# Patient Record
Sex: Female | Born: 1955 | Race: White | Hispanic: No | Marital: Married
Health system: Southern US, Community
[De-identification: ages and names within clinical notes are randomized; demographics above are authoritative.]

---

## 2011-12-31 ENCOUNTER — Ambulatory Visit: Payer: Self-pay | Admitting: Family Medicine

## 2012-01-19 ENCOUNTER — Ambulatory Visit: Payer: Self-pay | Admitting: Family Medicine

## 2012-09-01 IMAGING — US ULTRASOUND LEFT BREAST
1 series · 14 of 14 positions shown · non-contrast
Comparison: Left Breast Ultrasound from [REDACTED] on
05/02/2010, mammograms from 01/10/2012.

REASON FOR EXAM: US LT ENLARGED LYMP NODES
COMMENTS:

PROCEDURE:     US  - US BREAST LEFT  - January 19, 2012 [DATE]
RESULT:

[Series 1: ultrasound left breast · 14 of 14 slices shown]
[im 1/14]
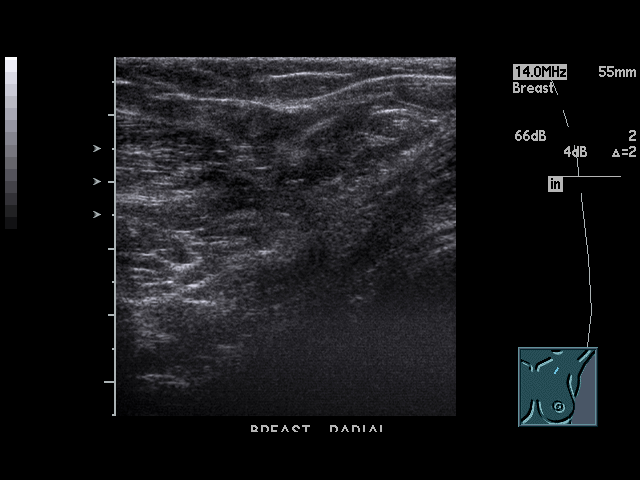
[im 2/14]
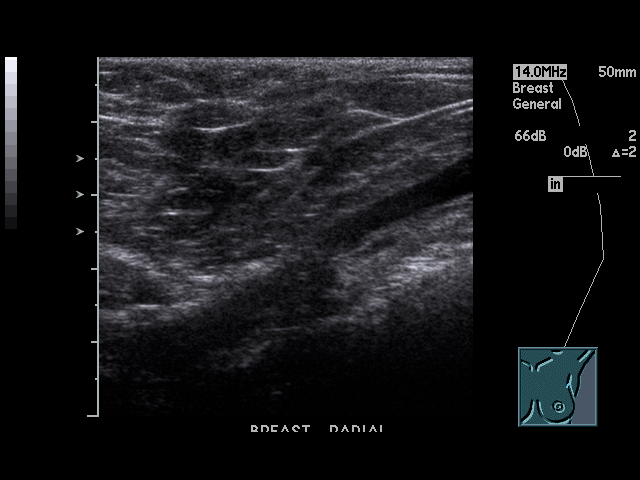
[im 3/14]
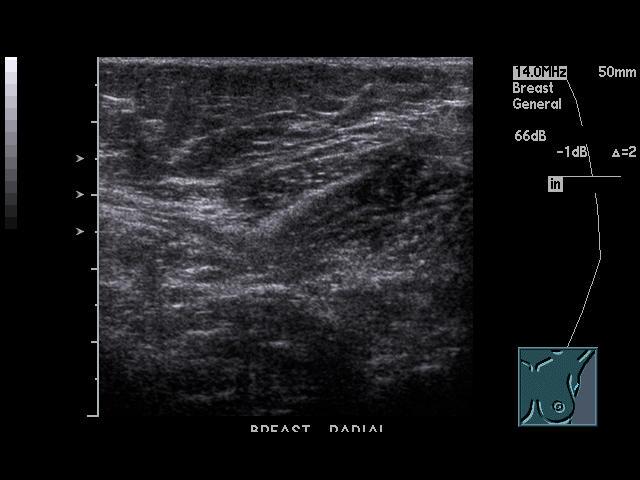
[im 4/14]
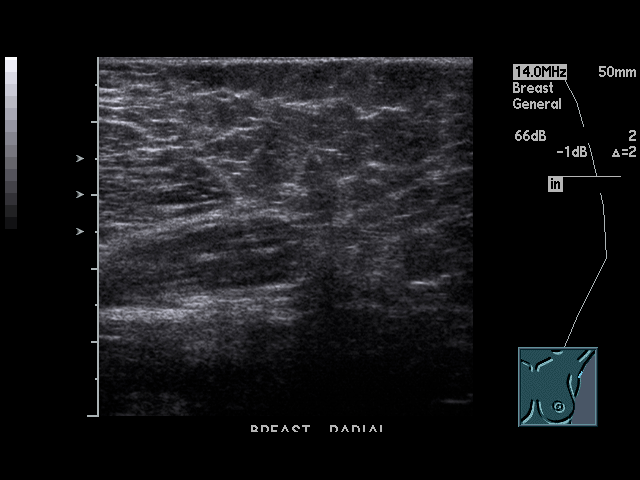
[im 5/14]
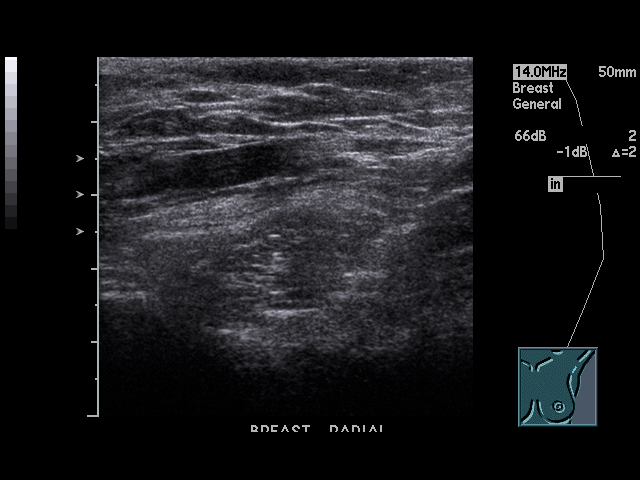
[im 6/14]
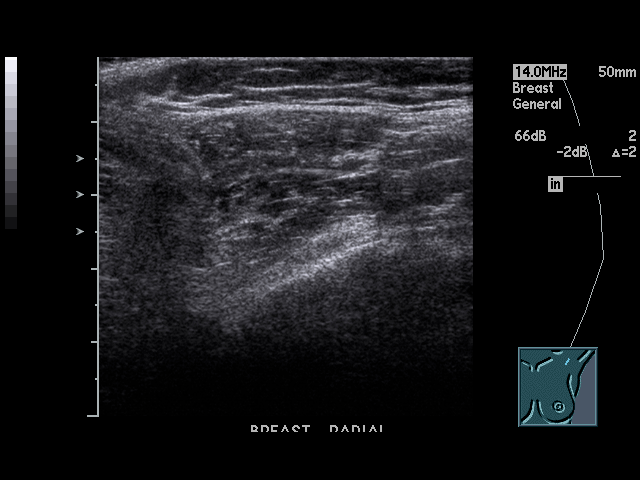
[im 7/14]
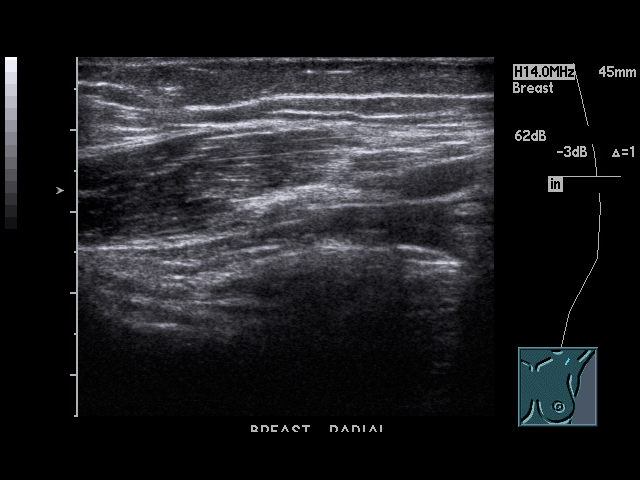
[im 8/14]
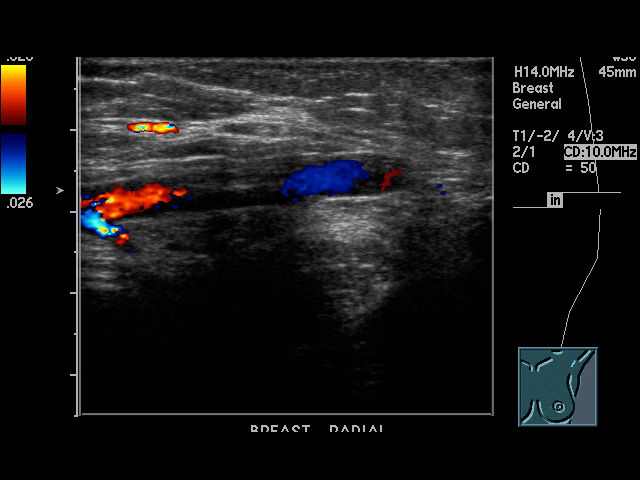
[im 9/14]
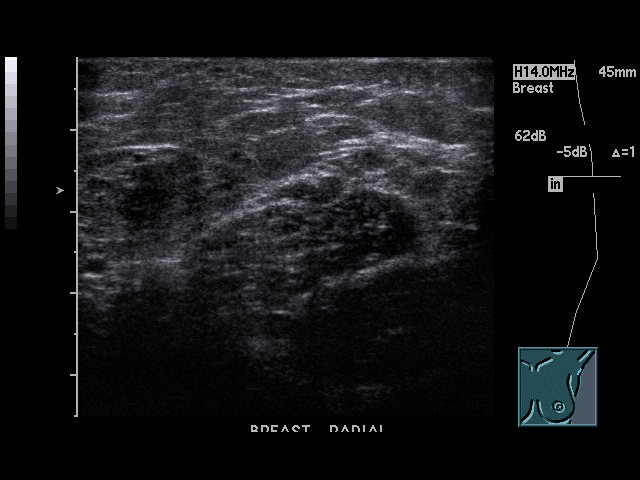
[im 10/14]
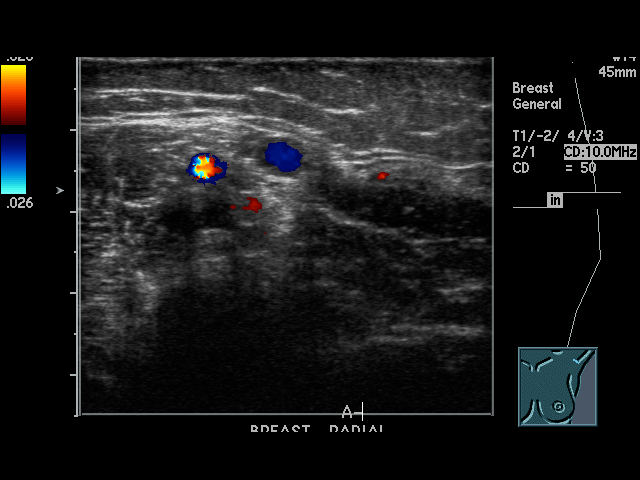
[im 11/14]
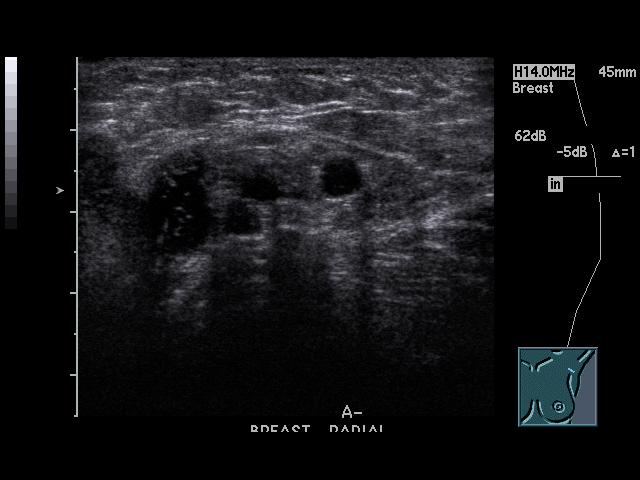
[im 12/14]
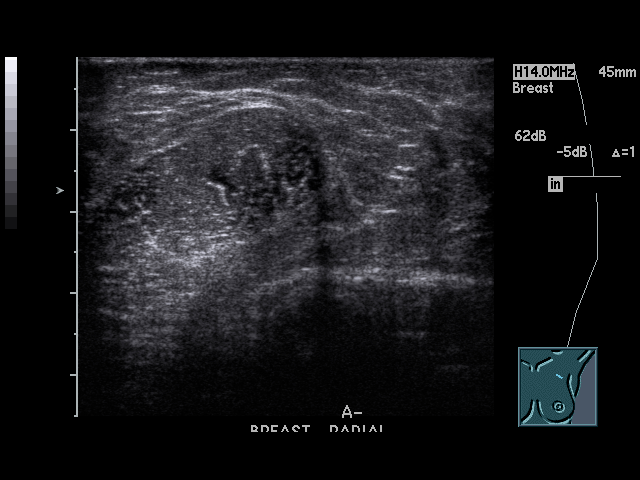
[im 13/14]
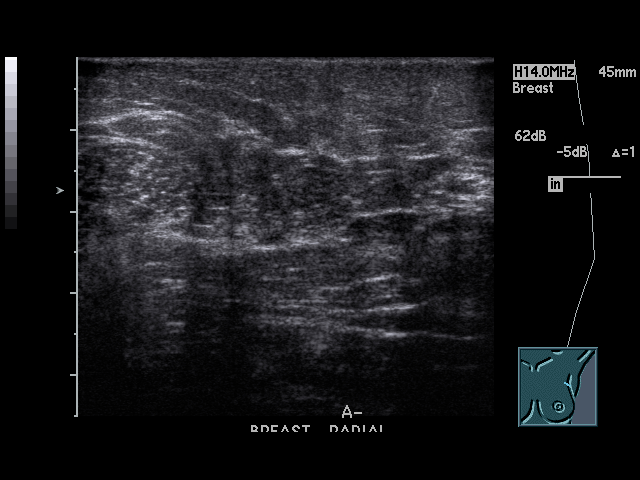
[im 14/14]
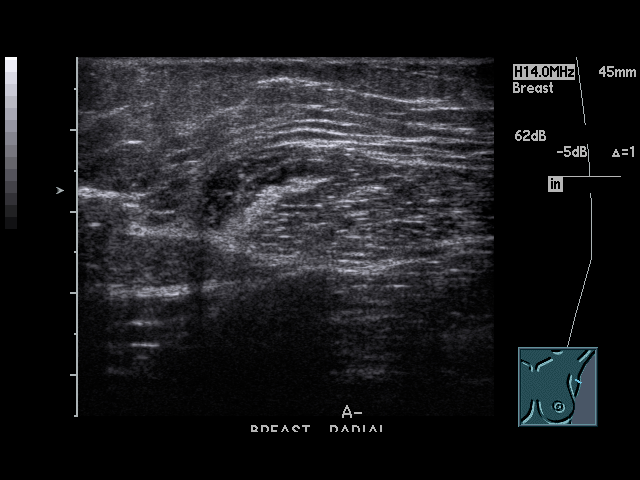

[14 of 14 positions shown; findings below may reference images not displayed]

FINDINGS: Real-time ultrasound was performed of the left axillary tail secondary to
follow-up recommendations from the prior Left Breast Ultrasound at the
outside institution. No mass or suspicious shadowing identified. No findings
to suggest lymph nodes seen. No findings are seen on today's study to
correlate with the findings on the outside prior ultrasound.
IMPRESSION: BI-RADS: Category 1 - Negative

RECOMMENDATION:  Continued annual screening mammography.

## 2014-11-23 ENCOUNTER — Ambulatory Visit: Payer: Self-pay | Admitting: Podiatry

## 2016-01-22 ENCOUNTER — Other Ambulatory Visit: Payer: Self-pay | Admitting: Family Medicine

## 2016-01-22 DIAGNOSIS — Z1231 Encounter for screening mammogram for malignant neoplasm of breast: Secondary | ICD-10-CM

## 2016-04-02 ENCOUNTER — Ambulatory Visit
Admission: RE | Admit: 2016-04-02 | Discharge: 2016-04-02 | Disposition: A | Discharge status: Home / Self Care | Payer: Self-pay | Source: Ambulatory Visit | Attending: Oncology | Admitting: Oncology

## 2016-04-02 ENCOUNTER — Ambulatory Visit: Payer: Self-pay | Attending: Oncology

## 2016-04-02 ENCOUNTER — Other Ambulatory Visit: Payer: Self-pay | Admitting: Oncology

## 2016-04-02 VITALS — BP 108/68 | HR 68 | Temp 98.0°F | Resp 12 | Ht 64.96 in | Wt 161.4 lb

## 2016-04-02 DIAGNOSIS — N644 Mastodynia: Secondary | ICD-10-CM

## 2016-04-02 NOTE — Progress Notes (Signed)
Subjective:     Patient ID: Kim Gregory, female   DOB: 1956/03/12, 60 y.o.   MRN: [id number]  HPI   Review of Systems     Objective:   Physical Exam  Pulmonary/Chest: Right breast exhibits no inverted nipple, no mass, no nipple discharge, no skin change and no tenderness. Left breast exhibits inverted nipple and tenderness. Left breast exhibits no mass, no nipple discharge and no skin change. Breasts are symmetrical.  Left nipple inversion normal per patient       Assessment:     60 [facility]   Patient screened, and meets B[facility]CP eligibility.  Patient does not have insurance, Medicare or Medicaid.  Handout given on Affordable Care Act. Instructed patient on breast self-exam using teach back method.  Patient had additional view ultrasound of left axillary nodes in 2013, and had a Birads 1 result.  Today she complains of left upper outer quadrant tenderness on palpation.  No mass our lump palpated.    Plan:     Sent for bilateral breast tomo mammogram, and ultrasound.

## 2016-04-03 NOTE — Progress Notes (Signed)
Letter mailed from [facility] to notify of normal mammogram results.  Patient to return in one year for annual screening.  Copy to [facility]. 

## 2016-12-24 ENCOUNTER — Ambulatory Visit: Payer: Medicare HMO | Attending: Neurology | Admitting: Physical Therapy

## 2016-12-24 DIAGNOSIS — R262 Difficulty in walking, not elsewhere classified: Secondary | ICD-10-CM | POA: Diagnosis present

## 2016-12-24 DIAGNOSIS — M6281 Muscle weakness (generalized): Secondary | ICD-10-CM | POA: Insufficient documentation

## 2016-12-24 DIAGNOSIS — R2689 Other abnormalities of gait and mobility: Secondary | ICD-10-CM | POA: Diagnosis present

## 2016-12-25 ENCOUNTER — Encounter: Payer: Self-pay | Admitting: Physical Therapy

## 2016-12-25 NOTE — Therapy (Signed)
[facility] [address] [place], [place], [address] Phone: [phone]   Fax:  [phone]  Physical Therapy Evaluation  Patient Details  Name: LORIE CLECKLEY MRN: [id number] Date of Birth: [birth year] Referring Provider: Dr. Cristopher Peru  Encounter Date: 12/24/2016      PT End of Session - 12/25/16 0925    Visit Number 1   Number of Visits 8   Date for PT Re-Evaluation 01/21/17   Authorization - Visit Number 1   Authorization - Number of Visits 10   PT Start Time 1015   PT Stop Time 1108   PT Time Calculation (min) 53 min   Equipment Utilized During Treatment Gait belt   Activity Tolerance Patient tolerated treatment well   Behavior During Therapy [facility] for tasks assessed/performed      History reviewed. No pertinent past medical history.  History reviewed. No pertinent surgical history.  There were no vitals filed for this visit.       Subjective Assessment - 12/25/16 0916    Subjective Pt. reports B LE muscle weakness and >6 falls over past several months.  Pts. sister report that she fell yesterday while carrying laundry basket over door threshold.  Pt. fell on knees (no injury reported).  Pt. reports 5/10 B plantar fascia pain.  Pt. enjoys reading/ crafts/ baking and is hoping to return to work.     Patient is accompained by: Family member   Limitations Standing;Walking;House hold activities;Other (comment)   Patient Stated Goals Increase B LE muscle strength to improve walking/ safety and return to work.    Currently in Pain? Yes   Pain Score 5    Pain Location Foot   Pain Type Chronic pain   Aggravating Factors  plantar fascia pain            [facility] PT Assessment - 12/25/16 0001      Assessment   Medical Diagnosis Imbalance/ Difficulty walking   Referring Provider Dr. Cristopher Peru   Onset Date/Surgical Date 12/23/15     Balance Screen   Has the patient fallen in the past 6 months Yes   How many times?  6+   Has the patient had a decrease in activity level because of a fear of falling?  Yes      Nustep L5 10 min. B UE/LE (consistent cadence/ no breaks).  Ambulate in PT clinic with instruction on 2-point gait pattern and use of SPC.  Max. Cuing for verbal cuing with demonstration provided.  Will issue HEP next tx. Session.         PT Education - 12/25/16 [phone]    Education provided Yes   Education Details Discussed walking/ ex. program/ [employer]   Person(s) Educated Patient   Methods Explanation;Demonstration   Comprehension Verbalized understanding;Returned demonstration             PT Long Term Goals - 12/25/16 0941      PT LONG TERM GOAL #1   Title Pt. I with HEP to increase B LE muscle strength 1/2 muscle grade to improve pain-free mobility/ independence with walking.    Baseline Pt. presents with good B UE/LE AROM WFL and B UE strength grossly 4/5 MMT, B LE strength grossly 4-/5 MMT except hip flexion 3+/5 MMT, hamstring/gluts 4+/5 MMT.   Time 4   Period Weeks   Status New     PT LONG TERM GOAL #2   Title Pt. will increase LEFS to >40 out  of 80 to improve functional mobility.     Baseline LEFS: 27 out of 80 on 1/3   Time 4   Period Weeks   Status New     PT LONG TERM GOAL #3   Title Pt. will increase Berg balance test to >50/56 to promote safety/ independence with gait.    Baseline Berg: 45/56 on 1/3.   Time 4   Period Weeks   Status New     PT LONG TERM GOAL #4   Title Pt. able to ambulate community distances with use of SPC and proper gait pattern to decrease fall risk/ improve safety.     Baseline ambulates with antalgic/ shuffling gait pattern with limited hip flexion.     Time 4   Period Weeks   Status New     PT LONG TERM GOAL #5   Title Pt. will report no falls or LOB consistent over a 2 week period to improve safety/ mobility.    Baseline 6 falls over past several months.     Time 4   Period Weeks   Status New               Plan  - 12/25/16 1610    Clinical Impression Statement Pt. is a pleasant 61 y/o female with c/o B peripheral neuropathy/ plantar foot pain and B LE muscle weakness.  Pt. reports >6 falls over past several months and states her walking has been getting worse.  Pt. will occasionally use of SPC while walking outside.  Pt. presents with good B UE/LE AROM WFL and B UE strength grossly 4/5 MMT, B LE strength grossly 4-/5 MMT except hip flexion 3+/5 MMT, hamstring/gluts 4+/5 MMT.  Pt. states she loses balance while bending over and looking up (posterior LOB).  LEFS: 27 out of 80.  Berg balance test: 45/56.  Pt. ambulates with shuffling gait pattern with limited B hip flexion/ heel strike without use of assistive device.  Pt. will benefit from skilled PT services to increase B LE muscle strength to improve safety/ walking with appriopriate assistive device.     Rehab Potential Good   PT Frequency 2x / week   PT Duration 4 weeks   PT Treatment/Interventions ADLs/Self Care Home Management;Aquatic Therapy;Gait training;Stair training;Functional mobility training;Therapeutic activities;Therapeutic exercise;Balance training;Patient/family education;Neuromuscular re-education;Manual techniques   PT Next Visit Plan ISSUE HEP NEXT TX SESSION      Patient will benefit from skilled therapeutic intervention in order to improve the following deficits and impairments:  Abnormal gait, Pain, Improper body mechanics, Postural dysfunction, Decreased mobility, Decreased coordination, Decreased activity tolerance, Decreased endurance, Decreased range of motion, Decreased strength, Difficulty walking, Decreased safety awareness, Decreased balance  Visit Diagnosis: Difficulty in walking, not elsewhere classified  Muscle weakness (generalized)  Imbalance      G-Codes - 01/13/17 1746    Functional Assessment Tool Used Clinical impression/ gait difficulty/ muscle weakness/ Berg/ LEFS   Functional Limitation Mobility: Walking and  moving around   Mobility: Walking and Moving Around Current Status ([id number]) At least 40 percent but less than 60 percent impaired, limited or restricted   Mobility: Walking and Moving Around Goal Status [phone]) At least 1 percent but less than 20 percent impaired, limited or restricted       Problem List There are no active problems to display for this patient.  Cammie Mcgee, PT, DPT # [id number] 12/25/2016, 9:47 AM  [facility] [address] [place], [place], [address]  Phone: [phone]   Fax:  [phone]  Name: Pricilla LovelessCarolyn S Crume MRN: [id number] Date of Birth: 1956-07-04

## 2016-12-26 ENCOUNTER — Ambulatory Visit: Payer: Medicare HMO | Admitting: Physical Therapy

## 2016-12-30 ENCOUNTER — Ambulatory Visit: Payer: Medicare HMO | Admitting: Physical Therapy

## 2016-12-30 DIAGNOSIS — R262 Difficulty in walking, not elsewhere classified: Secondary | ICD-10-CM

## 2016-12-30 DIAGNOSIS — R2689 Other abnormalities of gait and mobility: Secondary | ICD-10-CM

## 2016-12-30 DIAGNOSIS — M6281 Muscle weakness (generalized): Secondary | ICD-10-CM

## 2016-12-31 NOTE — Therapy (Addendum)
[facility] [address] [place], [place], [address] Phone: [phone]   Fax:  [phone]  Physical Therapy Treatment  Patient Details  Name: Kim Gregory MRN: [id number] Date of Birth: [birth year] Referring Provider: Dr. Cristopher Peru  Encounter Date: 12/30/2016      PT End of Session - 12/31/16 1810    Visit Number 2   Number of Visits 8   Date for PT Re-Evaluation 01/21/17   Authorization - Visit Number 2   Authorization - Number of Visits 10   PT Start Time 1501   PT Stop Time 1554   PT Time Calculation (min) 53 min   Equipment Utilized During Treatment Gait belt   Activity Tolerance Patient tolerated treatment well   Behavior During Therapy [facility] for tasks assessed/performed      No past medical history on file.  No past surgical history on file.  There were no vitals filed for this visit.      Subjective Assessment - 12/30/16 1500    Subjective Pt reports no pain at beginning of treatment with tingling in feet. Cramping occurs at night starting in both feet and travels up both LE to her hips. Pt reports she has practicing stepping up and down. Pt is eager to begin progressing and improving her balance skills. Pt reports no falls in past week.     Limitations Standing;Walking;House hold activities;Other (comment)   Patient Stated Goals Increase B LE muscle strength to improve walking/ safety and return to work.    Currently in Pain? No/denies   Pain Score 0-No pain      OBJECTIVE:  See HEP (page #1-4)- issued RTB with hip abd./ clamshell ex.  Scifit L5 10 min. B UE/LE (consistent cadence/ no rest breaks).  Neuro: tandem/ SLS ex. In //-bars progressing to no UE assist.  Tandem gait forward/ backwards.  Gait: ambulate in clinic with 2-point gait instruction with use of SPC.  Pt. Required moderate cuing initially with marked improvement/ step pattern as tx. Continued.  Pt. Will benefit from [facility] with outside walking to  decrease fall risk.  Pt. Instructed to practice this weekend.      Pt response for medical necessity: benefits from LE strengthening/ balance training to improve progress to [facility] to promote greater independence/ decrease fall risk.  Marked improvement in balance tasks today.    Marked improvement in pt. tandem/SLS without use of UE assist in //-bars.  Pt. demonstrates >30 sec. for tandem and 15-20 sec. with SLS.  Pt. demonstrates good understanding/ technique with current HEP (pg. #1-4)- issued RTB.  Pt. will continue with PT services 1x/week with focus on HEP.        PT Long Term Goals - 12/25/16 0941      PT LONG TERM GOAL #1   Title Pt. I with HEP to increase B LE muscle strength 1/2 muscle grade to improve pain-free mobility/ independence with walking.    Baseline Pt. presents with good B UE/LE AROM WFL and B UE strength grossly 4/5 MMT, B LE strength grossly 4-/5 MMT except hip flexion 3+/5 MMT, hamstring/gluts 4+/5 MMT.   Time 4   Period Weeks   Status New     PT LONG TERM GOAL #2   Title Pt. will increase LEFS to >40 out of 80 to improve functional mobility.     Baseline LEFS: 27 out of 80 on 1/3   Time 4   Period Weeks   Status New  PT LONG TERM GOAL #3   Title Pt. will increase Berg balance test to >50/56 to promote safety/ independence with gait.    Baseline Berg: 45/56 on 1/3.   Time 4   Period Weeks   Status New     PT LONG TERM GOAL #4   Title Pt. able to ambulate community distances with use of SPC and proper gait pattern to decrease fall risk/ improve safety.     Baseline ambulates with antalgic/ shuffling gait pattern with limited hip flexion.     Time 4   Period Weeks   Status New     PT LONG TERM GOAL #5   Title Pt. will report no falls or LOB consistent over a 2 week period to improve safety/ mobility.    Baseline 6 falls over past several months.     Time 4   Period Weeks   Status New               Plan - 12/31/16 1811    Rehab  Potential Good   PT Frequency 2x / week   PT Duration 4 weeks   PT Treatment/Interventions ADLs/Self Care Home Management;Aquatic Therapy;Gait training;Stair training;Functional mobility training;Therapeutic activities;Therapeutic exercise;Balance training;Patient/family education;Neuromuscular re-education;Manual techniques   PT Next Visit Plan Reassess HEP      Patient will benefit from skilled therapeutic intervention in order to improve the following deficits and impairments:  Abnormal gait, Pain, Improper body mechanics, Postural dysfunction, Decreased mobility, Decreased coordination, Decreased activity tolerance, Decreased endurance, Decreased range of motion, Decreased strength, Difficulty walking, Decreased safety awareness, Decreased balance  Visit Diagnosis: Muscle weakness (generalized)  Difficulty in walking, not elsewhere classified  Imbalance     Problem List There are no active problems to display for this patient.  Cammie McgeeMichael C Seung Nidiffer, PT, DPT # [phone] 12/31/2016, 6:14 PM  [facility] [address] [place], [place], [address] Phone: [phone]   Fax:  [phone]  Name: Kim Gregory MRN: [id number] Date of Birth: 06-Sep-1956

## 2017-01-01 ENCOUNTER — Encounter: Payer: Medicare HMO | Admitting: Physical Therapy

## 2017-01-05 ENCOUNTER — Encounter: Payer: Self-pay | Admitting: Physical Therapy

## 2017-01-05 ENCOUNTER — Ambulatory Visit: Payer: Medicare HMO | Admitting: Physical Therapy

## 2017-01-05 DIAGNOSIS — R262 Difficulty in walking, not elsewhere classified: Secondary | ICD-10-CM | POA: Diagnosis not present

## 2017-01-05 DIAGNOSIS — M6281 Muscle weakness (generalized): Secondary | ICD-10-CM

## 2017-01-05 DIAGNOSIS — R2689 Other abnormalities of gait and mobility: Secondary | ICD-10-CM

## 2017-01-06 NOTE — Therapy (Signed)
[facility] [address] [place], [place], [address] Phone: [phone]   Fax:  [phone]  Physical Therapy Treatment  Patient Details  Name: Pricilla LovelessCarolyn S Brindle MRN: [id number] Date of Birth: [birth year] Referring Provider: Dr. Cristopher PeruHemang Shah  Encounter Date: 01/05/2017      PT End of Session - 01/05/17 1753    Visit Number 3   Number of Visits 8   Date for PT Re-Evaluation 01/21/17   Authorization - Visit Number 3   Authorization - Number of Visits 10   PT Start Time 1116   PT Stop Time 1204   PT Time Calculation (min) 48 min   Equipment Utilized During Treatment Gait belt   Activity Tolerance Patient tolerated treatment well   Behavior During Therapy [facility] for tasks assessed/performed      History reviewed. No pertinent past medical history.  History reviewed. No pertinent surgical history.  There were no vitals filed for this visit.      Subjective Assessment - 01/05/17 1117    Subjective Pt. reports no falls this week with a little "staggering". Pt reports she has been practicing ambulating w/ SPc at home and has had cramps in legs starting near foot and works up near thighs, getting up and walking helps get rid of them. Pt reports she has been compliant w/ HEP and may have overdone it one day this week.   Pt. received approval for [employer] at [employer].     Limitations Standing;Walking;House hold activities;Other (comment)   Patient Stated Goals Increase B LE muscle strength to improve walking/ safety and return to work.    Currently in Pain? No/denies   Pain Score 0-No pain      Therex: Nustep L6 10 min (consistent cadence/no rest breaks)- no charge  Neuro: High marches down hallway w/ alternating hand touches 4x (280 ft.) Forward braiding 2x, backward braiding 2x  Fwd lunges in //-bars no UE assist Single leg balance on airex in //-bars - 15 sec holds each side  Marching in place - airex, no UE  assist, good ankle control on R.  Pt. response for medical necessity: Pt. benefits from balance progression training in order to improve LE strength and independent mobility/ decrease fall risk.          PT Long Term Goals - 12/25/16 0941      PT LONG TERM GOAL #1   Title Pt. I with HEP to increase B LE muscle strength 1/2 muscle grade to improve pain-free mobility/ independence with walking.    Baseline Pt. presents with good B UE/LE AROM WFL and B UE strength grossly 4/5 MMT, B LE strength grossly 4-/5 MMT except hip flexion 3+/5 MMT, hamstring/gluts 4+/5 MMT.   Time 4   Period Weeks   Status New     PT LONG TERM GOAL #2   Title Pt. will increase LEFS to >40 out of 80 to improve functional mobility.     Baseline LEFS: 27 out of 80 on 1/3   Time 4   Period Weeks   Status New     PT LONG TERM GOAL #3   Title Pt. will increase Berg balance test to >50/56 to promote safety/ independence with gait.    Baseline Berg: 45/56 on 1/3.   Time 4   Period Weeks   Status New     PT LONG TERM GOAL #4   Title Pt. able to ambulate community distances with use of SPC  and proper gait pattern to decrease fall risk/ improve safety.     Baseline ambulates with antalgic/ shuffling gait pattern with limited hip flexion.     Time 4   Period Weeks   Status New     PT LONG TERM GOAL #5   Title Pt. will report no falls or LOB consistent over a 2 week period to improve safety/ mobility.    Baseline 6 falls over past several months.     Time 4   Period Weeks   Status New            Plan - 01/05/17 1754    Clinical Impression Statement Pt. continuing to progress through dynamic standing balance exercises with less cuing/UE assist.  Pt able to maintain at least 15 sec. holds for SLS bilat. (no UE assist) on airex pad.  Good LE muscle endurance with standing/balance tasks but continues to require cuing for proper technique/ balance.  Pt. continues to present with forward head posture/ looking  at feet during higher level tasks.  Pt. really benefits from practice of ther.ex./ balance tasks to show improvement.    Rehab Potential Good   PT Frequency 1x / week   PT Duration 4 weeks   PT Treatment/Interventions ADLs/Self Care Home Management;Aquatic Therapy;Gait training;Stair training;Functional mobility training;Therapeutic activities;Therapeutic exercise;Balance training;Patient/family education;Neuromuscular re-education;Manual techniques   PT Next Visit Plan Progress HEP/ balance tasks.     Recommended Other Services [employer] at [employer].     Consulted and Agree with Plan of Care Patient      Patient will benefit from skilled therapeutic intervention in order to improve the following deficits and impairments:  Abnormal gait, Pain, Improper body mechanics, Postural dysfunction, Decreased mobility, Decreased coordination, Decreased activity tolerance, Decreased endurance, Decreased range of motion, Decreased strength, Difficulty walking, Decreased safety awareness, Decreased balance  Visit Diagnosis: Difficulty in walking, not elsewhere classified  Muscle weakness (generalized)  Imbalance     Problem List There are no active problems to display for this patient.  Cammie Mcgee, PT, DPT # [id number] Madelon Lips, SPT 01/06/2017, 9:11 AM  [facility] [address] [place], [place], [address] Phone: [phone]   Fax:  [phone]  Name: SHELVIE SALSBERRY MRN: [id number] Date of Birth: 1956-06-22

## 2017-01-08 ENCOUNTER — Encounter: Payer: Medicare HMO | Admitting: Physical Therapy

## 2017-01-13 ENCOUNTER — Encounter: Payer: Medicare HMO | Admitting: Physical Therapy

## 2017-01-15 ENCOUNTER — Encounter: Payer: Medicare HMO | Admitting: Physical Therapy

## 2017-02-24 ENCOUNTER — Encounter: Payer: Self-pay | Admitting: Physical Therapy

## 2017-02-24 ENCOUNTER — Ambulatory Visit: Payer: Medicare HMO | Attending: Neurology | Admitting: Physical Therapy

## 2017-02-24 DIAGNOSIS — M6281 Muscle weakness (generalized): Secondary | ICD-10-CM | POA: Diagnosis present

## 2017-02-24 DIAGNOSIS — R262 Difficulty in walking, not elsewhere classified: Secondary | ICD-10-CM

## 2017-02-24 DIAGNOSIS — R2689 Other abnormalities of gait and mobility: Secondary | ICD-10-CM

## 2017-02-24 NOTE — Therapy (Addendum)
[place] [facility] [address]. [place], [place], [address] Phone: [phone]   Fax:  [phone]  Physical Therapy Treatment  Patient Details  Name: Kim Gregory MRN: [id number] Date of Birth: [birth year] Referring Provider: Dr. Jennings Books  Encounter Date: 02/24/2017      PT End of Session - 02/25/17 0915    Visit Number 4   Number of Visits 8   Date for PT Re-Evaluation 03/24/17   Authorization - Visit Number 4   Authorization - Number of Visits 13   PT Start Time 0922   PT Stop Time 1019   PT Time Calculation (min) 57 min   Equipment Utilized During Treatment Gait belt   Activity Tolerance Patient tolerated treatment well   Behavior During Therapy [facility] for tasks assessed/performed      History reviewed. No pertinent past medical history.  History reviewed. No pertinent surgical history.  There were no vitals filed for this visit.      Subjective Assessment - 02/24/17 1438    Subjective Pt. states she has been sick with Bronchitis/ Flu causing her to miss several weeks of PT.  Pt. reports she has continued to participate with ex. program at home.  Pt. entered PT with use of SPC and reports no falls at this time.     Limitations Standing;Walking;House hold activities;Other (comment)   Patient Stated Goals Increase B LE muscle strength to improve walking/ safety and return to work.    Currently in Pain? No/denies      Therex: Nustep L6 10 min (consistent cadence/no rest breaks)- no charge/ warm-up  Neuro:  Berg balance test (54/56)- marked improvement since initial evaluation in January.   High marches down hallway w/ alternating hand touches 4x (280 ft.) Forward braiding 2x, backward braiding 2x  Fwd lunges in //-bars no UE assist Single leg balance on airex in //-bars - 15 sec holds each side  Marching in place - airex, no UE assist, good ankle control on R.  Pt. response for medical necessity: Pt. benefits from  balance progression training in order to improve LE strength and independent mobility/ decrease fall risk.    Pt. has had limited PT tx. sessions due to illness and reports feeling much better.  Pt. reports minimal R knee pain while walking into PT gym.  Pt. states she will occasionally use knee brace and SPC while walking outside.  Pt. presents with good B UE/LE AROM WFL and B UE strength grossly 4/5 MMT, B LE strength grossly 4/5 MMT except hip flexion 4-/5 MMT, hamstring/gluts 4+/5 MMT.  Pt. reports no falls or LOB since starting PT/ getting sick over past several weeks.  LEFS: 44 out of 80 (marked improvement since initial evaluation).  Berg balance test: 54/56 (pt. improved by 9 points)- significantly better.  Pt. ambulates with increase hip flexion/ step pattern with limited heel strike without use of assistive device.  Pt. will benefit from skilled PT services to increase B LE muscle strength to improve safety/ walking with household tasks.        PT Long Term Goals - 02/25/17 4656      PT LONG TERM GOAL #1   Title Pt. I with HEP to increase B LE muscle strength 1/2 muscle grade to improve pain-free mobility/ independence with walking.    Baseline Pt. presents with good B UE/LE AROM WFL and B UE strength grossly 4/5 MMT, B LE strength grossly 4/5 MMT except hip flexion 4/5 MMT,  hamstring/gluts 4+/5 MMT.   Time 4   Period Weeks   Status Partially Met     PT LONG TERM GOAL #2   Title Pt. will increase LEFS to >55 out of 80 to improve functional mobility.     Baseline LEFS: 27 out of 80 on 1/3,  44 out of 80 on 3/6   Time 4   Period Weeks   Status New     PT LONG TERM GOAL #3   Title Pt. will increase Berg balance test to >50/56 to promote safety/ independence with gait.    Baseline Berg: 45/56 on 1/3,  54/56 on 3/6   Time 4   Period Weeks   Status Achieved     PT LONG TERM GOAL #4   Title Pt. able to ambulate community distances with use of SPC and proper gait pattern to decrease  fall risk/ improve safety.     Baseline ambulates with slight antalgic gait pattern with occasional cuing to increase hip flexion/ step pattern.     Time 4   Period Weeks   Status Partially Met     PT LONG TERM GOAL #5   Title Pt. will report no falls or LOB consistent over a 2 week period to improve safety/ mobility.    Baseline No falls reported since initial PT evaluation in January 2018   Time 4   Period Weeks   Status Achieved     Additional Long Term Goals   Additional Long Term Goals Yes     PT LONG TERM GOAL #6   Title Pt. able to demonstrate proper floor to waist lifting/ B carrying of >20# with no balance issues.     Baseline pt. hasn't attempted any lifting and hoping to return to work.    Time 4   Period Weeks   Status New           Plan - 02/25/17 0916    Rehab Potential Good   PT Frequency 1x / week   PT Duration 4 weeks   PT Treatment/Interventions ADLs/Self Care Home Management;Aquatic Therapy;Gait training;Stair training;Functional mobility training;Therapeutic activities;Therapeutic exercise;Balance training;Patient/family education;Neuromuscular re-education;Manual techniques   PT Next Visit Plan Progress HEP/ balance tasks.     Recommended Other Services [employer] at [employer] and Agree with Plan of Care Patient      Patient will benefit from skilled therapeutic intervention in order to improve the following deficits and impairments:  Abnormal gait, Pain, Improper body mechanics, Postural dysfunction, Decreased mobility, Decreased coordination, Decreased activity tolerance, Decreased endurance, Decreased range of motion, Decreased strength, Difficulty walking, Decreased safety awareness, Decreased balance  Visit Diagnosis: Difficulty in walking, not elsewhere classified  Muscle weakness (generalized)  Imbalance       G-Codes - February 26, 2017 1617    Functional Assessment Tool Used (Outpatient Only) Clinical impression/ gait  difficulty/ muscle weakness/ Berg/ LEFS   Functional Limitation Mobility: Walking and moving around   Mobility: Walking and Moving Around Current Status (H3716) At least 20 percent but less than 40 percent impaired, limited or restricted   Mobility: Walking and Moving Around Goal Status (R6789) At least 1 percent but less than 20 percent impaired, limited or restricted      Problem List There are no active problems to display for this patient.  Pura Spice, PT, DPT # [id number] 02/25/2017, 4:18 PM  [place] [address] [place], [place], [address] Phone: [phone]  Fax:  [phone]  Name: Kim Gregory MRN: [id number] Date of Birth: Nov 27, 1956

## 2017-03-04 NOTE — Addendum Note (Signed)
Addended by: Cammie McgeeSHERK, MICHAEL C on: 03/04/2017 01:55 PM   Modules accepted: Orders

## 2017-03-11 ENCOUNTER — Ambulatory Visit: Payer: Medicare HMO | Admitting: Physical Therapy

## 2017-03-16 ENCOUNTER — Ambulatory Visit: Payer: Medicare HMO | Admitting: Physical Therapy

## 2017-03-16 DIAGNOSIS — R262 Difficulty in walking, not elsewhere classified: Secondary | ICD-10-CM

## 2017-03-16 DIAGNOSIS — M6281 Muscle weakness (generalized): Secondary | ICD-10-CM

## 2017-03-16 DIAGNOSIS — R2689 Other abnormalities of gait and mobility: Secondary | ICD-10-CM

## 2017-03-17 NOTE — Therapy (Signed)
[place] [facility] [address] [place], [place], [address] Phone: [phone]   Fax:  [phone]  Physical Therapy Treatment  Patient Details  Name: Kim Gregory MRN: [id number] Date of Birth: [birth year] Referring Provider: Dr. Jennings Books  Encounter Date: 03/16/2017      PT End of Session - 03/17/17 1818    Visit Number 5   Number of Visits 8   Date for PT Re-Evaluation 03/24/17   Authorization - Visit Number 5   Authorization - Number of Visits 13   PT Start Time 0936   PT Stop Time 1031   PT Time Calculation (min) 55 min   Equipment Utilized During Treatment Gait belt   Activity Tolerance Patient tolerated treatment well   Behavior During Therapy [facility] for tasks assessed/performed      No past medical history on file.  No past surgical history on file.  There were no vitals filed for this visit.      Subjective Assessment - 03/17/17 1816    Subjective Pt. states she has been doing well.  No falls and pt. reports good compliance with walking/ HEP.  Pt. is going to start [employer] ex. program soon.     Patient is accompained by: Family member   Limitations Standing;Walking;House hold activities;Other (comment)   Patient Stated Goals Increase B LE muscle strength to improve walking/ safety and return to work.    Currently in Pain? No/denies      Therex: Nustep L6 10 min (consistent cadence/no rest breaks)- no charge/ warm-up.  Reviewed standing there.ex. HEP in depth.  Discussed [employer]/ gym based ex.     Neuro:   High marches down hallway w/ alternating hand touches 4x.  EC walking in hallway with SBA for verbal cuing.   Forwardbraiding 2x, backward braiding 2x (no UE assist).   Fwd lunges in //-bars no UE assist Single leg balance on airex in //-bars - 15 sec holds each side  Marching in place - airex, no UE assist, good ankle control on R. Steps/overs 10x with no UE assist.   Star ex. On L/R (No  LOB).    Pt.response formedical necessity: Pt. benefitsfrom balance progression training in order to improve LE strength and independent mobility/ decrease fall risk.  Discharge from PT at this time.        PT Long Term Goals - 03/17/17 1822      PT LONG TERM GOAL #1   Title Pt. I with HEP to increase B LE muscle strength 1/2 muscle grade to improve pain-free mobility/ independence with walking.    Baseline WNL   Time 4   Period Weeks   Status Achieved     PT LONG TERM GOAL #2   Title Pt. will increase LEFS to >55 out of 80 to improve functional mobility.     Baseline LEFS: 27 out of 80 on 1/3,  44 out of 80 on 3/6   Time 4   Period Weeks   Status Partially Met     PT LONG TERM GOAL #3   Title Pt. will increase Berg balance test to >50/56 to promote safety/ independence with gait.    Baseline Berg: 45/56 on 1/3,  54/56 on 3/6   Time 4   Period Weeks   Status Achieved     PT LONG TERM GOAL #4   Title Pt. able to ambulate community distances with use of SPC and proper gait pattern to decrease fall  risk/ improve safety.     Baseline more normalized gait pattern with no assistive device.    Time 4   Period Weeks   Status Achieved     PT LONG TERM GOAL #5   Title Pt. will report no falls or LOB consistent over a 2 week period to improve safety/ mobility.    Baseline No falls reported since initial PT evaluation in January 2018   Time 4   Period Weeks   Status Achieved     PT LONG TERM GOAL #6   Title Pt. able to demonstrate proper floor to waist lifting/ B carrying of >20# with no balance issues.     Baseline good knee flexion with floor to waist lifting   Time 4   Period Weeks   Status Achieved            Plan - 03/17/17 1819    Clinical Impression Statement Pt. has progressed well towards all PT goals and will benefit from discharge from PT at this time with focus on an independent walking/ gym based/ HEP.  Pt. ambulating with more normalized gait pattern on  varying surfaces.  No issues with higher level balance tasks.  Discharge at this time.    Rehab Potential Good   PT Frequency 1x / week   PT Duration 4 weeks   PT Treatment/Interventions ADLs/Self Care Home Management;Aquatic Therapy;Gait training;Stair training;Functional mobility training;Therapeutic activities;Therapeutic exercise;Balance training;Patient/family education;Neuromuscular re-education;Manual techniques   PT Next Visit Plan Discharge from PT at this time.    Recommended Other Services [employer] at [employer] and Agree with Plan of Care Patient      Patient will benefit from skilled therapeutic intervention in order to improve the following deficits and impairments:  Abnormal gait, Pain, Improper body mechanics, Postural dysfunction, Decreased mobility, Decreased coordination, Decreased activity tolerance, Decreased endurance, Decreased range of motion, Decreased strength, Difficulty walking, Decreased safety awareness, Decreased balance  Visit Diagnosis: Difficulty in walking, not elsewhere classified  Muscle weakness (generalized)  Imbalance       G-Codes - 04-12-17 1824    Functional Assessment Tool Used (Outpatient Only) Clinical impression/ gait difficulty/ muscle weakness/ Berg/ LEFS   Functional Limitation Mobility: Walking and moving around   Mobility: Walking and Moving Around Current Status (G8115) At least 1 percent but less than 20 percent impaired, limited or restricted   Mobility: Walking and Moving Around Goal Status [phone]) At least 1 percent but less than 20 percent impaired, limited or restricted   Mobility: Walking and Moving Around Discharge Status [phone]) At least 1 percent but less than 20 percent impaired, limited or restricted      Problem List There are no active problems to display for this patient.  Pura Spice, PT, DPT # [id number] 03/17/2017, 6:24 PM  [facility] [address] [place], [place], [address] Phone: [phone]   Fax:  [phone]  Name: Kim Gregory MRN: [id number] Date of Birth: [birth year]

## 2017-03-25 ENCOUNTER — Ambulatory Visit: Payer: Medicare HMO | Admitting: Physical Therapy

## 2018-09-22 ENCOUNTER — Other Ambulatory Visit: Payer: Self-pay | Admitting: Family Medicine

## 2018-09-22 DIAGNOSIS — N644 Mastodynia: Secondary | ICD-10-CM

## 2018-09-29 ENCOUNTER — Ambulatory Visit
Admission: RE | Admit: 2018-09-29 | Discharge: 2018-09-29 | Disposition: A | Discharge status: Home / Self Care | Payer: Medicare HMO | Source: Ambulatory Visit | Attending: Family Medicine | Admitting: Family Medicine

## 2018-09-29 DIAGNOSIS — N644 Mastodynia: Secondary | ICD-10-CM

## 2021-10-18 ENCOUNTER — Other Ambulatory Visit: Payer: Self-pay | Admitting: Family Medicine

## 2021-11-04 ENCOUNTER — Other Ambulatory Visit: Payer: Self-pay | Admitting: Family Medicine

## 2021-11-04 DIAGNOSIS — Z1231 Encounter for screening mammogram for malignant neoplasm of breast: Secondary | ICD-10-CM

## 2022-02-14 ENCOUNTER — Ambulatory Visit
Admission: RE | Admit: 2022-02-14 | Discharge: 2022-02-14 | Disposition: A | Discharge status: Home / Self Care | Payer: Medicare HMO | Source: Ambulatory Visit | Attending: Family Medicine | Admitting: Family Medicine

## 2022-02-14 ENCOUNTER — Other Ambulatory Visit: Payer: Self-pay

## 2022-02-14 DIAGNOSIS — Z1231 Encounter for screening mammogram for malignant neoplasm of breast: Secondary | ICD-10-CM | POA: Diagnosis present

## 2022-04-22 ENCOUNTER — Other Ambulatory Visit: Payer: Self-pay | Admitting: Neurology

## 2022-04-22 DIAGNOSIS — G2581 Restless legs syndrome: Secondary | ICD-10-CM

## 2022-04-22 DIAGNOSIS — M79605 Pain in left leg: Secondary | ICD-10-CM

## 2022-04-22 DIAGNOSIS — G609 Hereditary and idiopathic neuropathy, unspecified: Secondary | ICD-10-CM

## 2022-04-24 ENCOUNTER — Ambulatory Visit
Admission: RE | Admit: 2022-04-24 | Discharge: 2022-04-24 | Disposition: A | Discharge status: Home / Self Care | Payer: Medicare HMO | Source: Ambulatory Visit | Attending: Neurology | Admitting: Neurology

## 2022-04-24 DIAGNOSIS — M79605 Pain in left leg: Secondary | ICD-10-CM | POA: Diagnosis present

## 2022-04-24 DIAGNOSIS — G2581 Restless legs syndrome: Secondary | ICD-10-CM | POA: Diagnosis not present

## 2022-04-24 DIAGNOSIS — G609 Hereditary and idiopathic neuropathy, unspecified: Secondary | ICD-10-CM | POA: Diagnosis present

## 2022-09-28 IMAGING — MG MM DIGITAL SCREENING BILAT W/ TOMO AND CAD
8 series · 8 of 24 positions shown · non-contrast
Comparison: Previous exam(s).

CLINICAL DATA: Screening.

EXAM:
DIGITAL SCREENING BILATERAL MAMMOGRAM WITH TOMOSYNTHESIS AND CAD
TECHNIQUE: Bilateral screening digital craniocaudal and mediolateral oblique
mammograms were obtained. Bilateral screening digital breast
tomosynthesis was performed. The images were evaluated with
computer-aided detection.

[L MLO synth-2D]
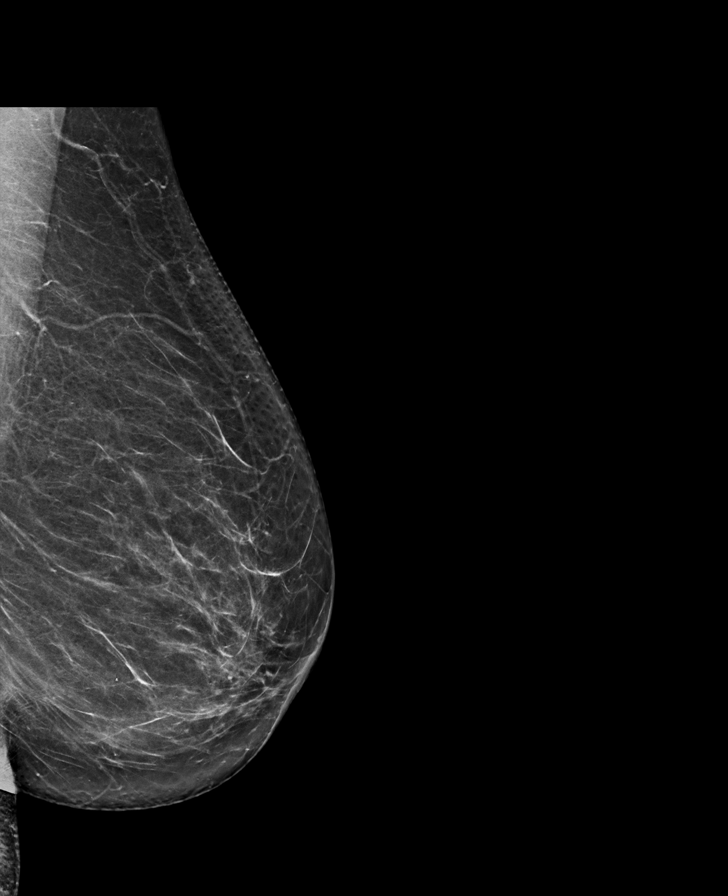

[L CC synth-2D]
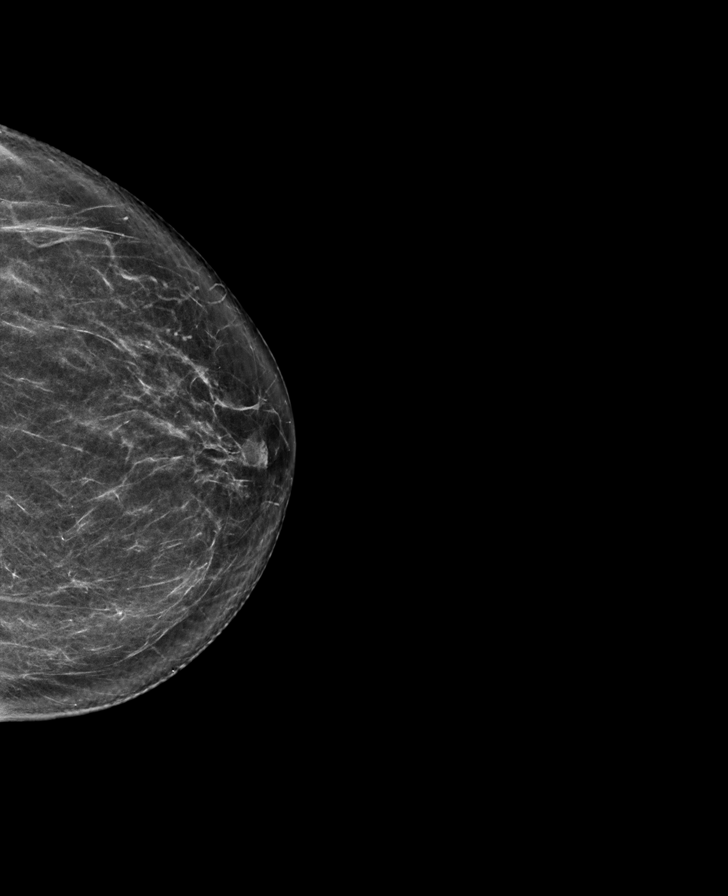

[R CC synth-2D]
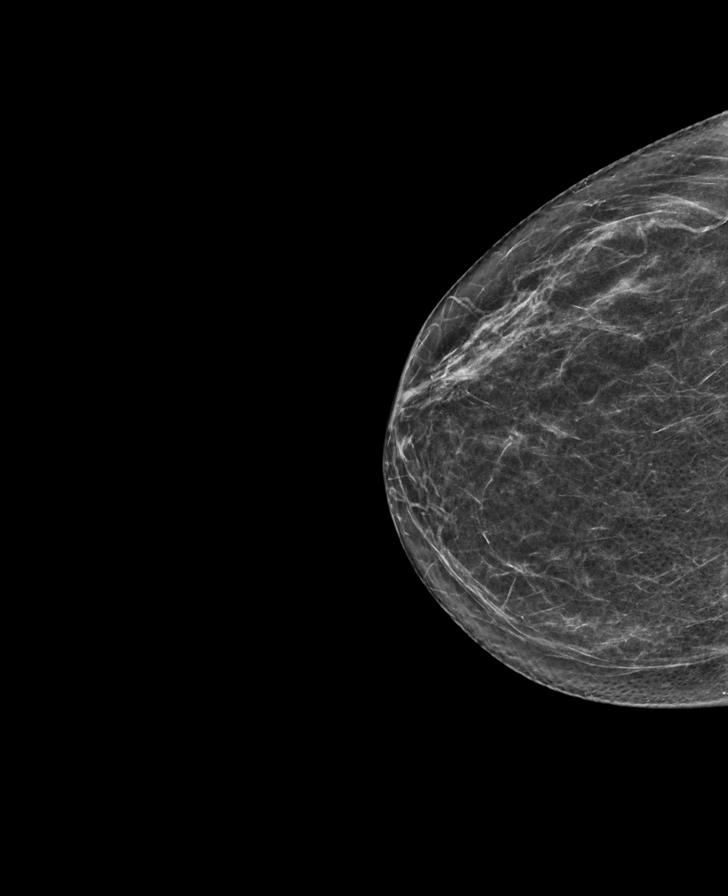

[R MLO synth-2D]
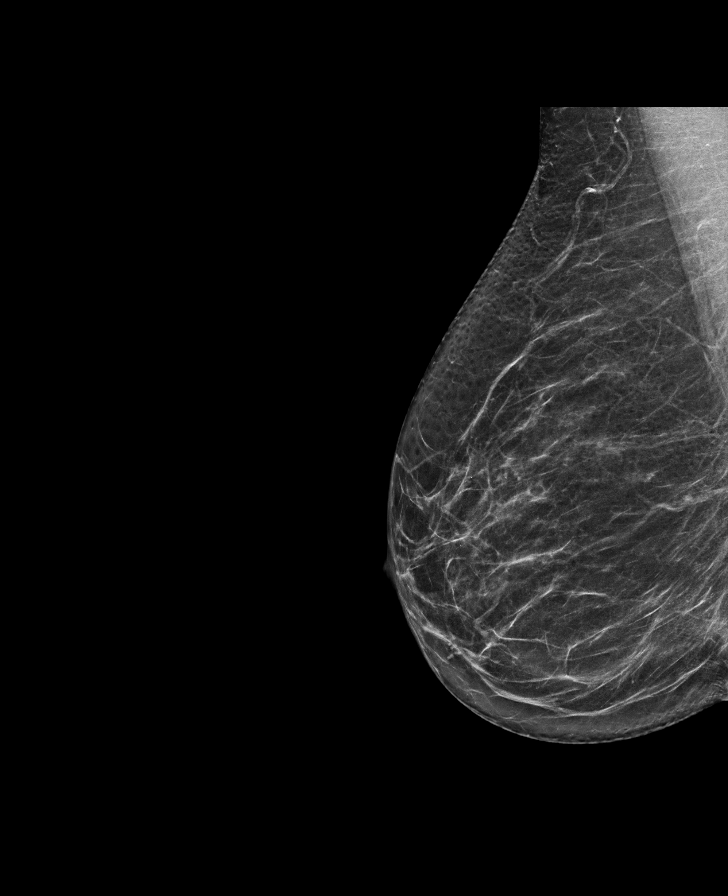

[L CC tomo · tomo slice 37/72.0]
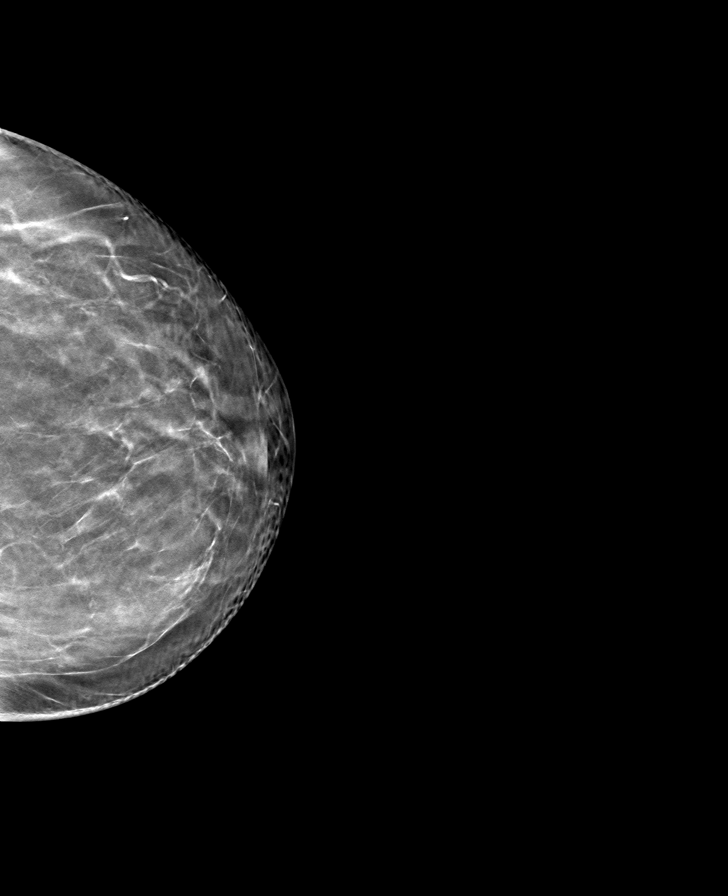

[L MLO tomo · tomo slice 37/74.0]
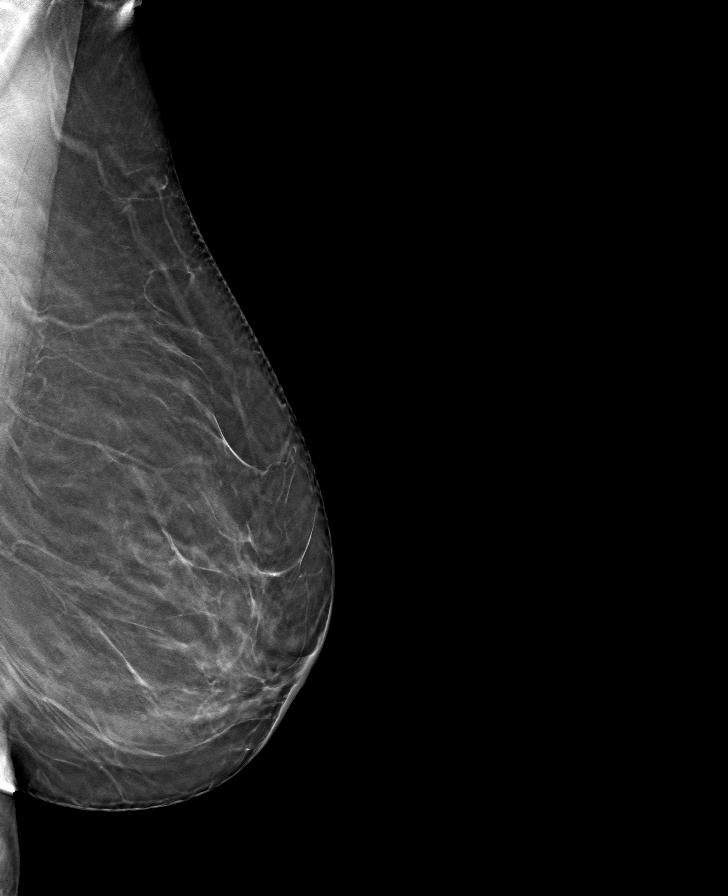

[R MLO tomo · tomo slice 35/69.0]
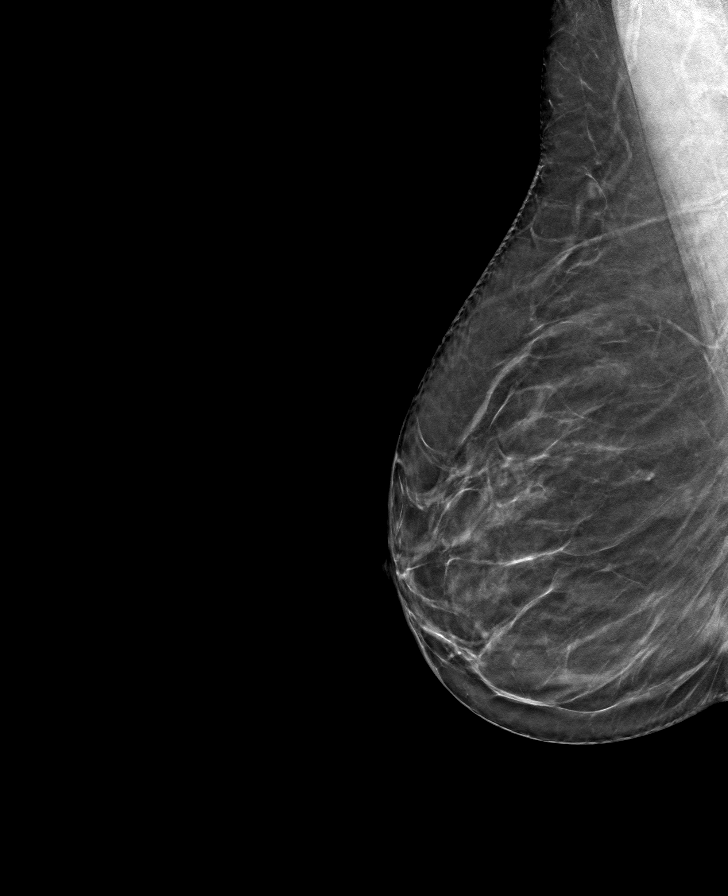

[R CC tomo · tomo slice 31/62.0]
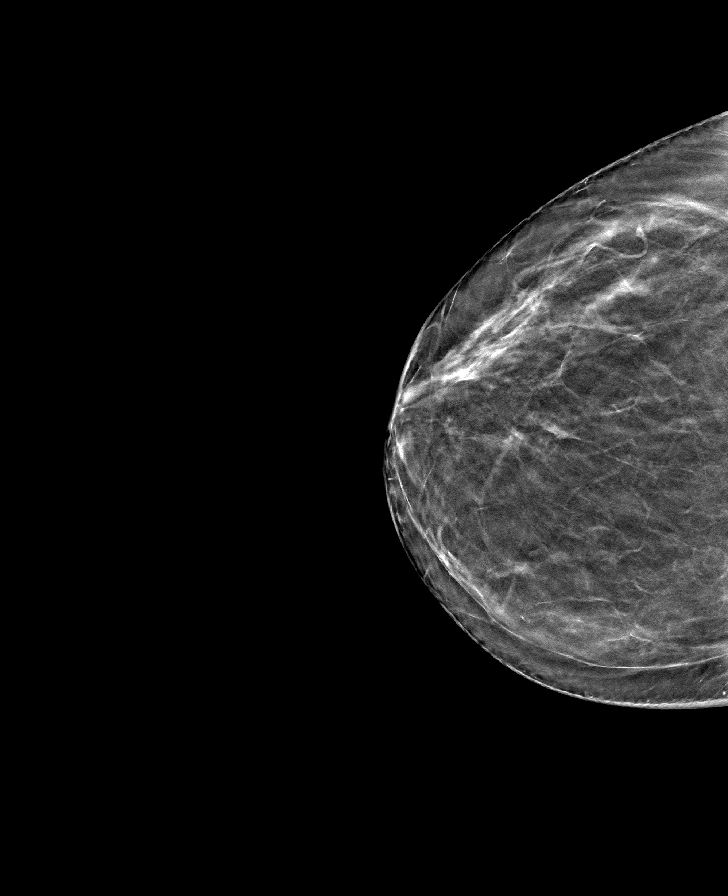

[8 of 24 positions shown; findings below may reference images not displayed]

ACR Breast Density Category b: There are scattered areas of
fibroglandular density.
FINDINGS: There are no findings suspicious for malignancy.
IMPRESSION: No mammographic evidence of malignancy. A result letter of this
screening mammogram will be mailed directly to the patient.

RECOMMENDATION:
Screening mammogram in one year. (Code:51-O-LD2)

BI-RADS CATEGORY  1: Negative.

## 2022-12-06 IMAGING — US US EXTREM LOW VENOUS*L*
1 series · 14 of 24 positions shown · non-contrast
Comparison: 11/23/2014

CLINICAL DATA: Left lower extremity pain

EXAM:
LEFT LOWER EXTREMITY VENOUS DOPPLER ULTRASOUND
TECHNIQUE: Gray-scale sonography with compression, as well as color and duplex
ultrasound, were performed to evaluate the deep venous system(s)
from the level of the common femoral vein through the popliteal and
proximal calf veins.

[Series 1: us extrem low venous*left* · 0.07mm/px · 37 acquisitions, 14 frames shown]
[im 1/37]
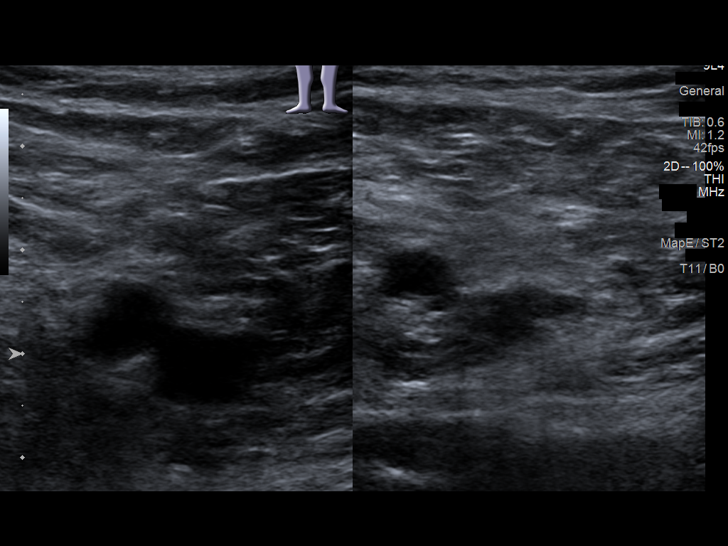
[im 4/37]
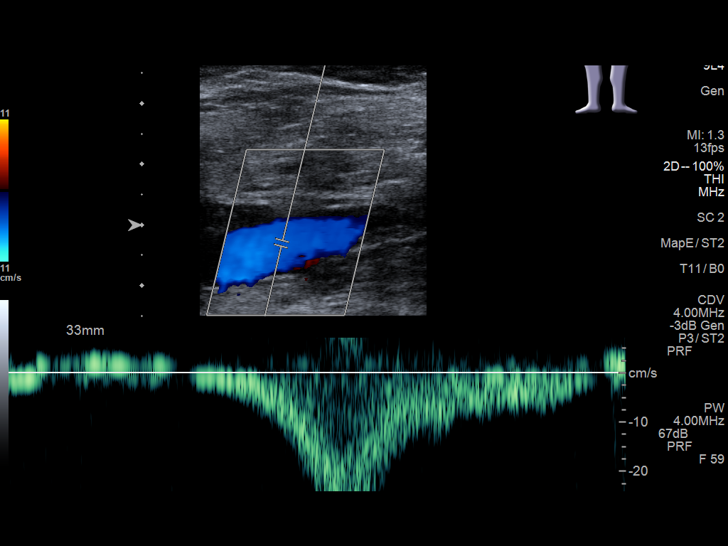
[im 7/37]
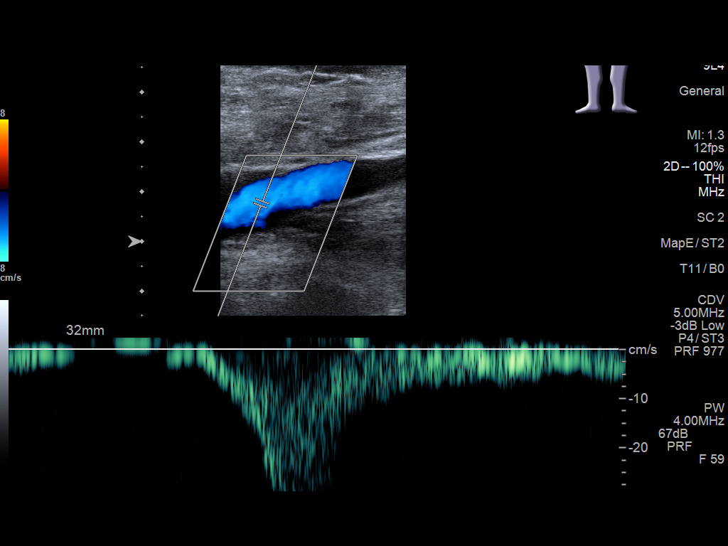
[im 10/37]
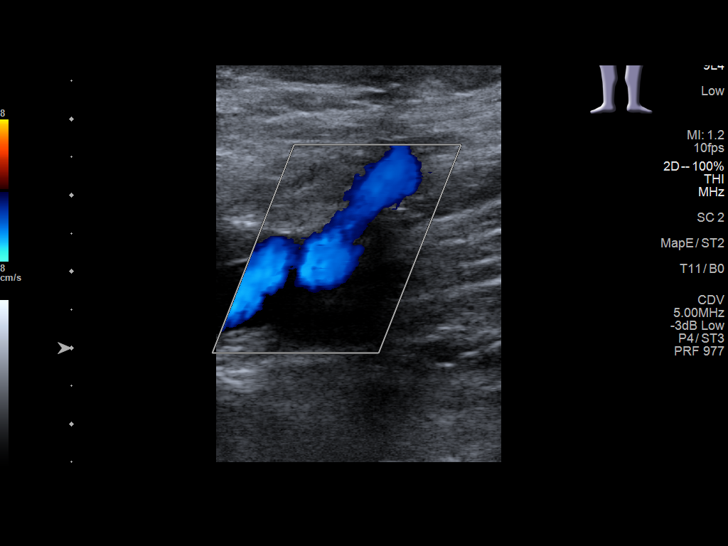
[im 11/37]
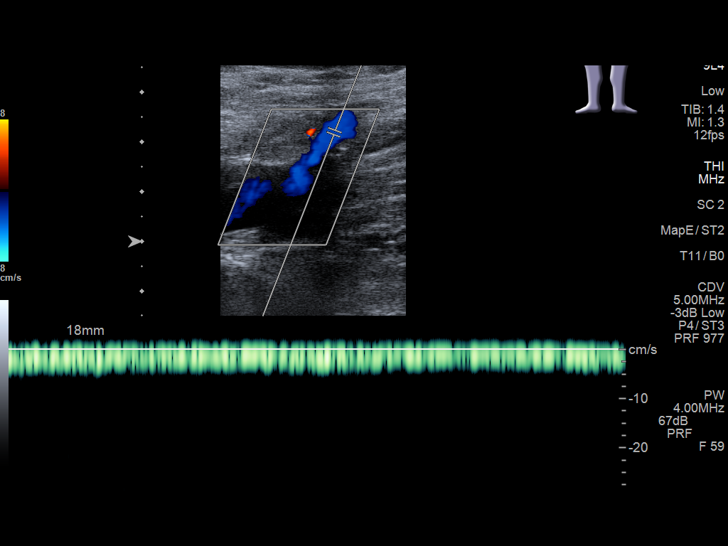
[im 15/37]
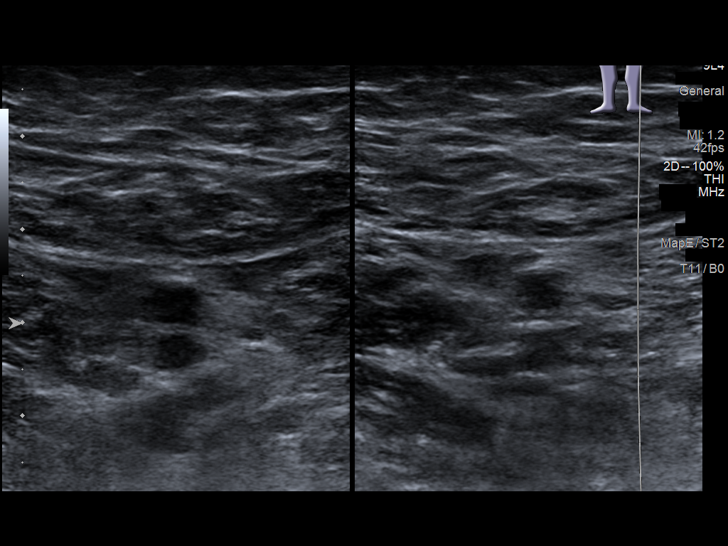
[im 18/37]
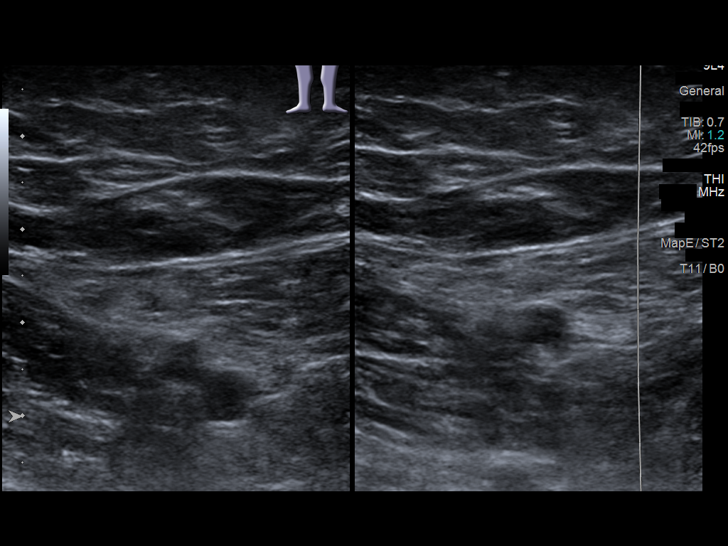
[im 21/37]
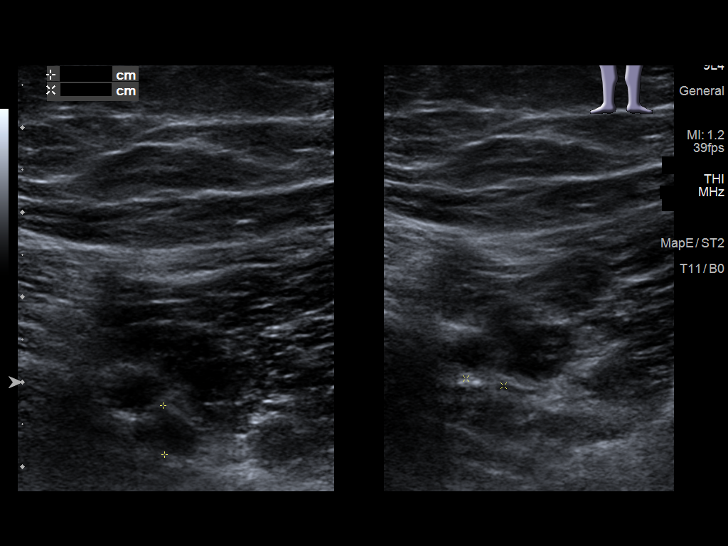
[im 24/37]
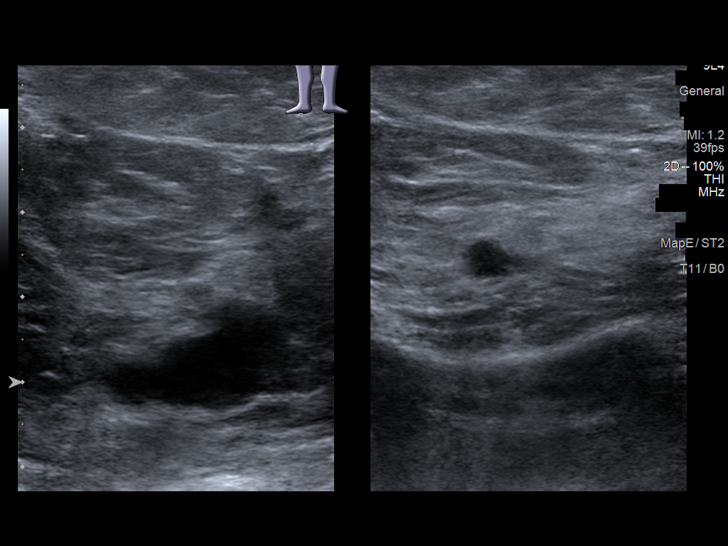
[im 27/37]
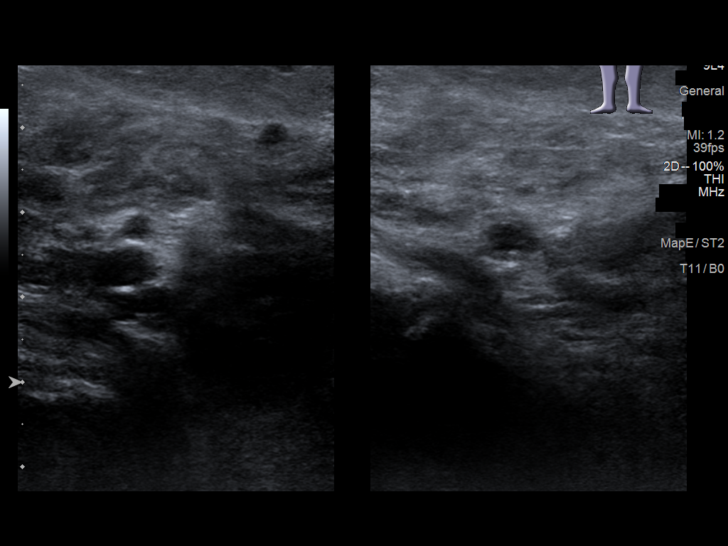
[im 30/37]
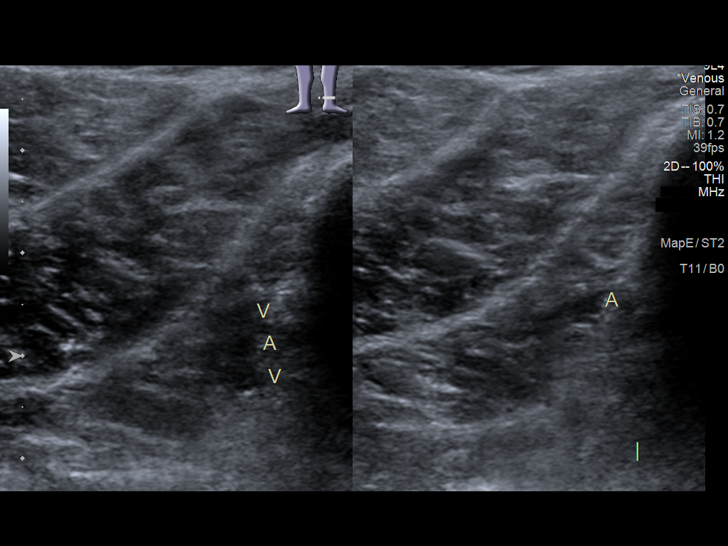
[im 32/37]
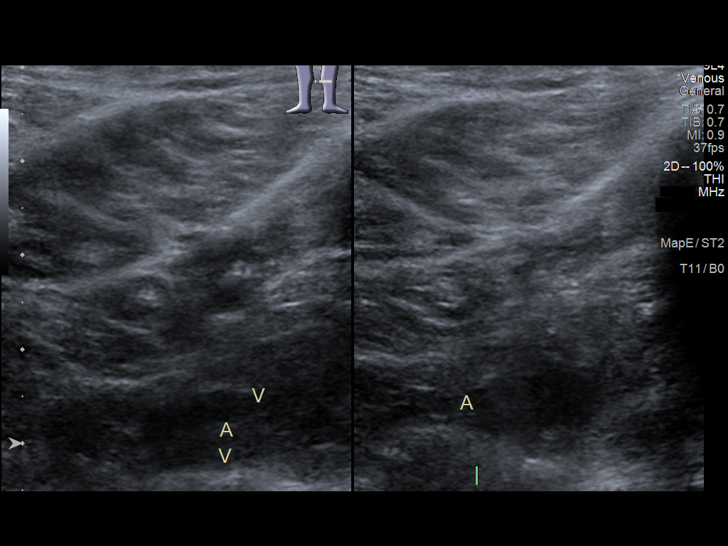
[im 35/37]
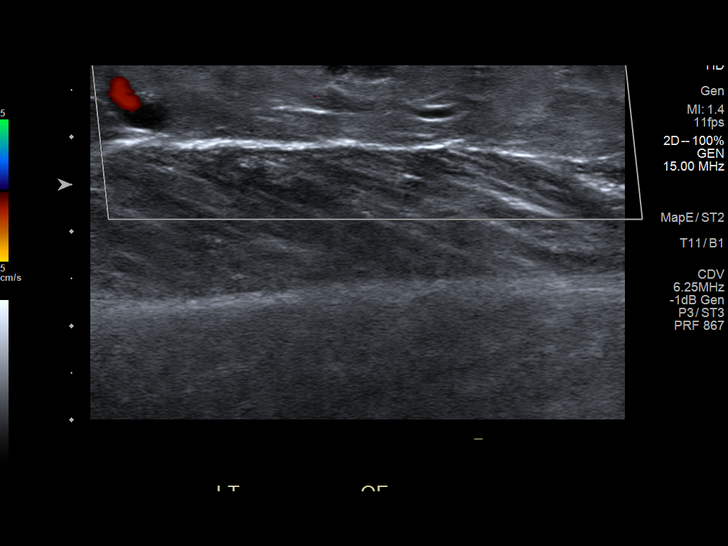
[im 37/37]
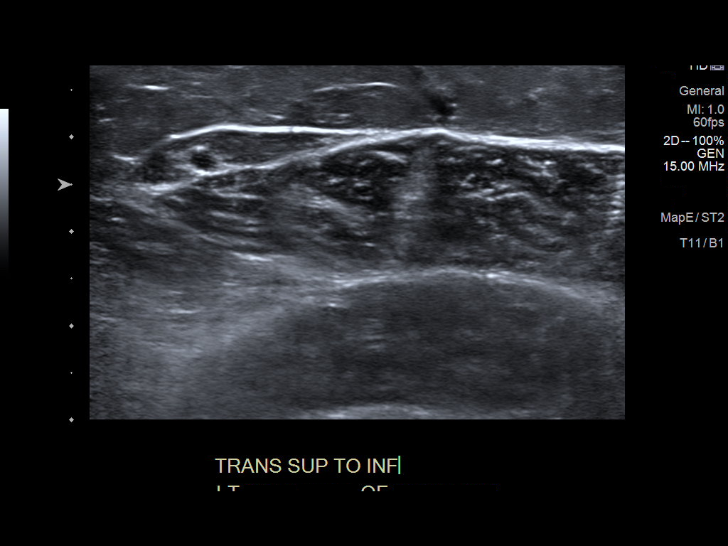

[14 of 24 positions shown; findings below may reference images not displayed]

FINDINGS: VENOUS

Normal compressibility of the common femoral, superficial femoral,
and popliteal veins, as well as the visualized calf veins.
Visualized portions of profunda femoral vein and great saphenous
vein unremarkable. No filling defects to suggest DVT on grayscale or
color Doppler imaging. Doppler waveforms show normal direction of
venous flow, normal respiratory plasticity and response to
augmentation.

Limited views of the contralateral common femoral vein are
unremarkable.

OTHER

Thrombosed varicose vein seen in the calf.

Limitations: none
IMPRESSION: 1. No left lower extremity DVT.
2. Thrombosed superficial varicose vein noted in the calf.

## 2023-01-21 ENCOUNTER — Ambulatory Visit
Admission: RE | Admit: 2023-01-21 | Discharge: 2023-01-21 | Disposition: A | Discharge status: Home / Self Care | Payer: Medicare HMO | Source: Ambulatory Visit | Attending: Family Medicine | Admitting: Family Medicine

## 2023-01-21 ENCOUNTER — Other Ambulatory Visit: Payer: Self-pay | Admitting: Family Medicine

## 2023-01-21 ENCOUNTER — Ambulatory Visit
Admission: RE | Admit: 2023-01-21 | Discharge: 2023-01-21 | Disposition: A | Discharge status: Home / Self Care | Payer: Medicare HMO | Attending: Family Medicine | Admitting: Family Medicine

## 2023-01-21 DIAGNOSIS — R590 Localized enlarged lymph nodes: Secondary | ICD-10-CM | POA: Diagnosis present

## 2023-02-03 ENCOUNTER — Other Ambulatory Visit: Payer: Self-pay | Admitting: Family Medicine

## 2023-02-03 DIAGNOSIS — Z1231 Encounter for screening mammogram for malignant neoplasm of breast: Secondary | ICD-10-CM

## 2023-02-23 ENCOUNTER — Ambulatory Visit
Admission: RE | Admit: 2023-02-23 | Discharge: 2023-02-23 | Disposition: A | Discharge status: Home / Self Care | Payer: Medicare HMO | Source: Ambulatory Visit | Attending: Family Medicine | Admitting: Family Medicine

## 2023-02-23 DIAGNOSIS — Z1231 Encounter for screening mammogram for malignant neoplasm of breast: Secondary | ICD-10-CM | POA: Diagnosis not present

## 2023-07-02 ENCOUNTER — Other Ambulatory Visit: Payer: Self-pay | Admitting: Neurology

## 2023-07-02 DIAGNOSIS — R42 Dizziness and giddiness: Secondary | ICD-10-CM

## 2023-07-15 ENCOUNTER — Encounter: Payer: Self-pay | Admitting: Neurology

## 2023-07-17 ENCOUNTER — Ambulatory Visit
Admission: RE | Admit: 2023-07-17 | Discharge: 2023-07-17 | Disposition: A | Discharge status: Home / Self Care | Payer: Medicare HMO | Source: Ambulatory Visit | Attending: Neurology | Admitting: Neurology

## 2023-07-17 DIAGNOSIS — R42 Dizziness and giddiness: Secondary | ICD-10-CM

## 2024-04-05 ENCOUNTER — Other Ambulatory Visit: Payer: Self-pay | Admitting: Family Medicine

## 2024-04-05 DIAGNOSIS — Z1231 Encounter for screening mammogram for malignant neoplasm of breast: Secondary | ICD-10-CM

## 2024-04-13 ENCOUNTER — Ambulatory Visit
Admission: RE | Admit: 2024-04-13 | Discharge: 2024-04-13 | Disposition: A | Discharge status: Home / Self Care | Source: Ambulatory Visit | Attending: Family Medicine | Admitting: Family Medicine

## 2024-04-13 DIAGNOSIS — Z1231 Encounter for screening mammogram for malignant neoplasm of breast: Secondary | ICD-10-CM | POA: Insufficient documentation

## 2024-10-06 ENCOUNTER — Other Ambulatory Visit: Payer: Self-pay | Admitting: Family Medicine

## 2024-10-06 DIAGNOSIS — Z780 Asymptomatic menopausal state: Secondary | ICD-10-CM

## 2024-10-06 DIAGNOSIS — Z13820 Encounter for screening for osteoporosis: Secondary | ICD-10-CM

## 2024-10-12 ENCOUNTER — Other Ambulatory Visit: Payer: Self-pay

## 2024-10-12 DIAGNOSIS — M79671 Pain in right foot: Secondary | ICD-10-CM

## 2024-10-12 DIAGNOSIS — M84374A Stress fracture, right foot, initial encounter for fracture: Secondary | ICD-10-CM

## 2024-10-15 ENCOUNTER — Ambulatory Visit: Admission: RE | Admit: 2024-10-15 | Discharge: 2024-10-15 | Disposition: A | Source: Ambulatory Visit

## 2024-10-15 DIAGNOSIS — M79671 Pain in right foot: Secondary | ICD-10-CM

## 2024-10-15 DIAGNOSIS — M84374A Stress fracture, right foot, initial encounter for fracture: Secondary | ICD-10-CM

## 2024-10-26 ENCOUNTER — Ambulatory Visit
Admission: RE | Admit: 2024-10-26 | Discharge: 2024-10-26 | Disposition: A | Discharge status: Home / Self Care | Source: Ambulatory Visit | Attending: Family Medicine | Admitting: Family Medicine

## 2024-10-26 DIAGNOSIS — Z780 Asymptomatic menopausal state: Secondary | ICD-10-CM | POA: Insufficient documentation

## 2024-10-26 DIAGNOSIS — Z13820 Encounter for screening for osteoporosis: Secondary | ICD-10-CM | POA: Insufficient documentation
